# Patient Record
Sex: Male | Born: 1970
Health system: Southern US, Community
[De-identification: ages and names within clinical notes are randomized; demographics above are authoritative.]

## PROBLEM LIST (undated history)

## (undated) DIAGNOSIS — F32A Depression, unspecified: Secondary | ICD-10-CM

## (undated) DIAGNOSIS — F329 Major depressive disorder, single episode, unspecified: Secondary | ICD-10-CM

## (undated) DIAGNOSIS — F419 Anxiety disorder, unspecified: Secondary | ICD-10-CM

---
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
## 2003-09-08 ENCOUNTER — Emergency Department (HOSPITAL_COMMUNITY): Admission: EM | Admit: 2003-09-08 | Discharge: 2003-09-08 | Payer: Self-pay | Admitting: Emergency Medicine

## 2003-11-21 ENCOUNTER — Emergency Department (HOSPITAL_COMMUNITY): Admission: EM | Admit: 2003-11-21 | Discharge: 2003-11-21 | Payer: Self-pay | Admitting: Family Medicine

## 2019-11-25 DIAGNOSIS — Z125 Encounter for screening for malignant neoplasm of prostate: Secondary | ICD-10-CM | POA: Diagnosis not present

## 2019-11-25 DIAGNOSIS — Z1211 Encounter for screening for malignant neoplasm of colon: Secondary | ICD-10-CM | POA: Diagnosis not present

## 2019-11-25 DIAGNOSIS — G479 Sleep disorder, unspecified: Secondary | ICD-10-CM | POA: Diagnosis not present

## 2019-11-25 DIAGNOSIS — Z0000 Encounter for general adult medical examination without abnormal findings: Secondary | ICD-10-CM | POA: Diagnosis not present

## 2019-11-25 DIAGNOSIS — L209 Atopic dermatitis, unspecified: Secondary | ICD-10-CM | POA: Diagnosis not present

## 2019-11-25 DIAGNOSIS — Z13220 Encounter for screening for lipoid disorders: Secondary | ICD-10-CM | POA: Diagnosis not present

## 2019-11-25 DIAGNOSIS — F329 Major depressive disorder, single episode, unspecified: Secondary | ICD-10-CM | POA: Diagnosis not present

## 2019-12-06 ENCOUNTER — Other Ambulatory Visit: Payer: Self-pay

## 2019-12-06 ENCOUNTER — Encounter (HOSPITAL_COMMUNITY): Payer: Self-pay | Admitting: *Deleted

## 2019-12-06 ENCOUNTER — Ambulatory Visit (HOSPITAL_COMMUNITY)
Admission: EM | Admit: 2019-12-06 | Discharge: 2019-12-06 | Disposition: A | Discharge status: Home / Self Care | Payer: 59 | Attending: Family Medicine | Admitting: Family Medicine

## 2019-12-06 ENCOUNTER — Emergency Department (HOSPITAL_COMMUNITY)
Admission: EM | Admit: 2019-12-06 | Discharge: 2019-12-06 | Disposition: A | Discharge status: Home / Self Care | Payer: 59 | Attending: Emergency Medicine | Admitting: Emergency Medicine

## 2019-12-06 ENCOUNTER — Emergency Department (HOSPITAL_COMMUNITY): Payer: 59

## 2019-12-06 DIAGNOSIS — R232 Flushing: Secondary | ICD-10-CM

## 2019-12-06 DIAGNOSIS — R0789 Other chest pain: Secondary | ICD-10-CM

## 2019-12-06 DIAGNOSIS — R42 Dizziness and giddiness: Secondary | ICD-10-CM

## 2019-12-06 DIAGNOSIS — R079 Chest pain, unspecified: Secondary | ICD-10-CM | POA: Diagnosis not present

## 2019-12-06 HISTORY — DX: Anxiety disorder, unspecified: F41.9

## 2019-12-06 HISTORY — DX: Depression, unspecified: F32.A

## 2019-12-06 LAB — BASIC METABOLIC PANEL
Anion gap: 9 (ref 5–15)
BUN: 15 mg/dL (ref 6–20)
CO2: 26 mmol/L (ref 22–32)
Calcium: 9.6 mg/dL (ref 8.9–10.3)
Chloride: 104 mmol/L (ref 98–111)
Creatinine, Ser: 0.99 mg/dL (ref 0.61–1.24)
GFR calc Af Amer: >60 mL/min (ref >60)
GFR calc non Af Amer: >60 mL/min (ref >60)
Glucose, Bld: 93 mg/dL (ref 70–99)
Potassium: 4 mmol/L (ref 3.5–5.1)
Sodium: 139 mmol/L (ref 135–145)

## 2019-12-06 LAB — TROPONIN I (HIGH SENSITIVITY)
Troponin I (High Sensitivity): 3 ng/L (ref <18)
Troponin I (High Sensitivity): 3 ng/L (ref <18)

## 2019-12-06 LAB — CBC
HCT: 45.7 % (ref 39.0–52.0)
Hemoglobin: 16.4 g/dL (ref 13.0–17.0)
MCH: 30.7 pg (ref 26.0–34.0)
MCHC: 35.9 g/dL (ref 30.0–36.0)
MCV: 85.6 fL (ref 80.0–100.0)
Platelets: 193 10*3/uL (ref 150–400)
RBC: 5.34 MIL/uL (ref 4.22–5.81)
RDW: 12 % (ref 11.5–15.5)
WBC: 9.3 10*3/uL (ref 4.0–10.5)
nRBC: 0 % (ref 0.0–0.2)

## 2019-12-06 MED ORDER — SODIUM CHLORIDE 0.9% FLUSH: 3.0000 mL | Freq: Once | INTRAVENOUS | Status: DC

## 2019-12-06 NOTE — ED Triage Notes (Signed)
Patient reports he starting taking escitalopram 11/25/19 and now is reporting heat waves, irribility, fast HR, chest tightness, insomnia. Reports he is only sleeping two hours.

## 2019-12-06 NOTE — Discharge Instructions (Signed)
I feel that some of her symptoms are likely related to the Lexapro would like to taper you off of this medication, take 1/2 tablet for the next 5 days and then you can stop the medication.  Please follow-up with your primary care doctor in the meantime to discuss other medication options.

## 2019-12-06 NOTE — ED Provider Notes (Signed)
[name]   [id number] 12/06/19 Arrival Time: 5038  ASSESSMENT & PLAN:  1. Chest heaviness   2. Episodic lightheadedness   3. Hot flashes     Myriad of symptoms but with reported chest heaviness this morning recommend ED evaluation. No acute changes on ECG.   ECG: Performed today and interpreted by me: normal sinus rhythm; no STEMI. No previous for comparison.  Patient taken to ED via wheelchair. Stable upon discharge.  Reviewed expectations re: course of current medical issues. Questions answered. Outlined signs and symptoms indicating need for more acute intervention. Patient verbalized understanding. After Visit Summary given.   SUBJECTIVE:  History from: patient. William Watkins is a 49 y.o. male who presents with complaint of chest heaviness beginning approx 07:30 this morning. Has eased but remains present. Reports insomnia over the past week along with frequent hot flashes. Feels very irritable. Questions increased heart rate at times. Normal PO intake without n/v. No reports SOB with chest symptoms. Afebrile. No recent illnesses. No LE edema. Non-smoker. No heavy or frequent alcohol use. No drug use. No recent immobilization, surgery, or prolonged travel. Does report starting Lexapro about 1.5 w ago; questions if medications causing some of his symptoms. Denies: exertional chest pressure/discomfort, orthopnea, paroxysmal nocturnal dyspnea and syncope. Aggravating factors: have not been identified. Alleviating factors: have not been identified. Ambulatory without assistance.  OBJECTIVE:  Vitals:   12/06/19 0926  BP: (!) 131/94  Pulse: 82  Resp: 17  Temp: 98.5 F (36.9 C)  TempSrc: Oral  SpO2: 97%    General appearance: alert, oriented, no acute distress; does appear anxious Eyes: PERRLA; EOMI; conjunctivae normal HENT: normocephalic; atraumatic Neck: supple with FROM Lungs: without labored respirations; speaks full sentences without  difficulty Heart: regular Extremities: without edema; without calf swelling Skin: warm and dry; without rash or lesions Neuro: normal gait Psychological: alert and cooperative; normal mood and affect    Allergies  Allergen Reactions  . Penicillins Rash    Childhood allergy     Past Medical History:  Diagnosis Date  . Anxiety   . Depression    Social History   Socioeconomic History  . Marital status: Single    Spouse name: Not on file  . Number of children: Not on file  . Years of education: Not on file  . Highest education level: Not on file  Occupational History  . Not on file  Tobacco Use  . Smoking status: Never Smoker  Substance and Sexual Activity  . Alcohol use: Never  . Drug use: Never  . Sexual activity: Not on file  Other Topics Concern  . Not on file  Social History Narrative  . Not on file   Social Determinants of Health   Financial Resource Strain:   . Difficulty of Paying Living Expenses:   Food Insecurity:   . Worried About Charity fundraiser in the Last Year:   . Arboriculturist in the Last Year:   Transportation Needs:   . Film/video editor (Medical):   Marland Kitchen Lack of Transportation (Non-Medical):   Physical Activity:   . Days of Exercise per Week:   . Minutes of Exercise per Session:   Stress:   . Feeling of Stress :   Social Connections:   . Frequency of Communication with Friends and Family:   . Frequency of Social Gatherings with Friends and Family:   . Attends Religious Services:   . Active Member of Clubs or Organizations:   .  Attends Archivist Meetings:   Marland Kitchen Marital Status:   Intimate Partner Violence:   . Fear of Current or Ex-Partner:   . Emotionally Abused:   Marland Kitchen Physically Abused:   . Sexually Abused:      No past surgical history on file.   Vanessa Kick, MD 12/06/19 1216

## 2019-12-06 NOTE — ED Notes (Signed)
Bed: UCTR Expected date:  Expected time:  Means of arrival:  Comments: SICK ROOMS 9-10 

## 2019-12-06 NOTE — ED Notes (Signed)
Patient Alert and oriented to baseline. Stable and ambulatory to baseline. Patient verbalized understanding of the discharge instructions.  Patient belongings were taken by the patient.   

## 2019-12-06 NOTE — ED Provider Notes (Signed)
[facility] EMERGENCY DEPARTMENT Provider Note   CSN: [id number] Arrival date & time: 12/06/19  2703     History Chief Complaint  Patient presents with  . Chest Pain    William Watkins is a 49 y.o. male.  William Watkins is a 49 y.o. male with a history of anxiety and depression, who presents to the emergency department for evaluation of chest discomfort.  Patient reports that his primary care doctor started him on a new medication, Lexapro 10 mg, he began taking this on 4/1.  He reports since then he has been having intermittent episodes of chest discomfort L as well as what he describes as waves of heat going over his body.  He is occasionally felt a bit lightheaded but has not had any syncopal episodes.  He reports last night for about an hour he had intermittent flashes of heat that kept waking him up.  He states that at baseline he has had sleep problems for about 6 months, but since starting the Lexapro he is only been able to sleep for about 2 hours a night.  He reports he had some chest discomfort last night, and again had an episode this morning while at work where his chest felt heavy.  Symptoms were not exertional or pleuritic.  Not associated with any nausea, vomiting or diaphoresis.  Patient denies any associated tremors.  He does report an intermittent headache.  No visual changes.  He states he is not been on a medication like this before.  Does state that even before starting this medication he would have occasional chest discomfort but has never seen a cardiologist.  He does not feel particularly anxious.  He was initially seen at urgent care, but sent to the ED for further evaluation of chest heaviness.  No history of heart disease, no history of PE or DVT, no recent long distance travel or surgeries, no hormonal therapy.        Past Medical History:  Diagnosis Date  . Anxiety   . Depression     There are no problems to display for this  patient.   History reviewed. No pertinent surgical history.     History reviewed. No pertinent family history.  Social History   Tobacco Use  . Smoking status: Never Smoker  Substance Use Topics  . Alcohol use: Never  . Drug use: Never    Home Medications Prior to Admission medications   Medication Sig Start Date End Date Taking? Authorizing Provider  escitalopram (LEXAPRO) 10 MG tablet Take 10 mg by mouth daily.    [provider]    Allergies    Penicillins  Review of Systems   Review of Systems  Constitutional: Negative for chills and fever.  HENT: Negative.   Eyes: Negative for visual disturbance.  Respiratory: Negative for cough and shortness of breath.   Cardiovascular: Positive for chest pain.  Gastrointestinal: Negative for abdominal pain, nausea and vomiting.  Genitourinary: Negative for dysuria and frequency.  Musculoskeletal: Negative for arthralgias and myalgias.  Skin: Negative for color change and rash.  Neurological: Positive for light-headedness and headaches.  Psychiatric/Behavioral: Positive for sleep disturbance.  All other systems reviewed and are negative.   Physical Exam Updated Vital Signs BP (!) 138/92 (BP Location: Left Arm)   Pulse 83   Temp 97.8 F (36.6 C) (Oral)   Resp 14   Ht 5\' 10"  (1.778 m)   Wt 73 kg   SpO2 100%   BMI  23.10 kg/m   Physical Exam Vitals and nursing note reviewed.  Constitutional:      General: He is not in acute distress.    Appearance: He is well-developed and normal weight. He is not ill-appearing or diaphoretic.     Comments: Appears somewhat anxious, but is well-appearing and in no distress  HENT:     Head: Normocephalic and atraumatic.  Eyes:     General:        Right eye: No discharge.        Left eye: No discharge.     Pupils: Pupils are equal, round, and reactive to light.  Cardiovascular:     Rate and Rhythm: Normal rate and regular rhythm.     Pulses:          Radial pulses are  2+ on the right side and 2+ on the left side.     Heart sounds: Normal heart sounds. No murmur. No friction rub. No gallop.   Pulmonary:     Effort: Pulmonary effort is normal. No respiratory distress.     Breath sounds: Normal breath sounds. No wheezing or rales.     Comments: Respirations equal and unlabored, patient able to speak in full sentences, lungs clear to auscultation bilaterally Chest:     Chest wall: No tenderness.  Abdominal:     General: Bowel sounds are normal. There is no distension.     Palpations: Abdomen is soft. There is no mass.     Tenderness: There is no abdominal tenderness. There is no guarding.     Comments: Abdomen soft, nondistended, nontender to palpation in all quadrants without guarding or peritoneal signs  Musculoskeletal:        General: No deformity.     Cervical back: Neck supple.  Skin:    General: Skin is warm and dry.     Capillary Refill: Capillary refill takes less than 2 seconds.  Neurological:     Mental Status: He is alert.     Coordination: Coordination normal.     Comments: Speech is clear, able to follow commands Moves extremities without ataxia, coordination intact  Psychiatric:        Mood and Affect: Mood is anxious. Affect is flat.        Behavior: Behavior normal. Behavior is cooperative.     ED Results / Procedures / Treatments   Labs (all labs ordered are listed, but only abnormal results are displayed) Labs Reviewed  BASIC METABOLIC PANEL  CBC  TROPONIN I (HIGH SENSITIVITY)  TROPONIN I (HIGH SENSITIVITY)    EKG EKG Interpretation  Date/Time:  Monday December 06 2019 13:12:15 EDT Ventricular Rate:  68 PR Interval:    QRS Duration: 82 QT Interval:  416 QTC Calculation: 443 R Axis:   82 Text Interpretation: Sinus rhythm Anteroseptal infarct, age indeterminate Baseline wander Abnormal ECG Confirmed by Carmin Muskrat [id number]) on 12/06/2019 1:26:29 PM   Radiology DG Chest 2 View  Result Date: 12/06/2019 CLINICAL  DATA:  Chest pain. EXAM: CHEST - 2 VIEW COMPARISON:  None. FINDINGS: The heart size and mediastinal contours are within normal limits. Both lungs are clear. No pneumothorax or pleural effusion is noted. The visualized skeletal structures are unremarkable. IMPRESSION: No active cardiopulmonary disease. Electronically Signed   By: Marijo Conception M.D.   On: 12/06/2019 10:43    Procedures Procedures (including critical care time)  Medications Ordered in ED Medications  sodium chloride flush (NS) 0.9 % injection 3 mL (has no administration in  time range)    ED Course  I have reviewed the triage vital signs and the nursing notes.  Pertinent labs & imaging results that were available during my care of the patient were reviewed by me and considered in my medical decision making (see chart for details).    MDM Rules/Calculators/A&P                     Patient presents to the emergency department with chest pain. Patient nontoxic appearing, in no apparent distress, vitals without significant abnormality. Fairly benign physical exam.    DDX: Feel symptoms are most likely related to medication side effect, but also consider, ACS, pulmonary embolism, dissection, pneumothorax, pneumonia, arrhythmia, severe anemia, MSK, GERD, anxiety. Evaluation initiated with labs, EKG, and CXR. Patient on cardiac monitor.   CBC: No leukocytosis, normal hemoglobin BMP: No electrolyte derangements and normal renal function Troponin: Negative x2 EKG: Sinus rhythm with some anteroseptal artifact. CXR:  Negative, without infiltrate, effusion, pneumothorax, or fracture/dislocation.   EKG without obvious acute ischemia, delta troponin negative, doubt ACS. Patient is low risk wells, PERC negative, doubt pulmonary embolism. Pain is not a tearing sensation, symmetric pulses, no widening of mediastinum on CXR, doubt dissection. Cardiac monitor reviewed, no notable arrhythmias or tachycardia.  I suspect symptoms may be related  to new SSRI, but patient does not appear to be in serotonin syndrome.  Will taper patient off of this medication and have him follow-up closely with PCP.  Patient has appeared hemodynamically stable throughout ER visit and appears safe for discharge with close PCP/cardiology follow up. I discussed results, treatment plan, need for PCP follow-up, and return precautions with the patient. Provided opportunity for questions, patient confirmed understanding and is in agreement with plan.   Final Clinical Impression(s) / ED Diagnoses Final diagnoses:  Atypical chest pain    Rx / DC Orders ED Discharge Orders    None       Legrand Rams 12/06/19 1434    Gerhard Munch, MD 12/08/19 1539

## 2019-12-06 NOTE — ED Triage Notes (Addendum)
Pt reports onset this am of hot flashes, palpitations, chest pressure, fatigue and just not feeling well. Ekg done at triage. No acute distress is noted.

## 2019-12-15 DIAGNOSIS — F329 Major depressive disorder, single episode, unspecified: Secondary | ICD-10-CM | POA: Diagnosis not present

## 2019-12-15 DIAGNOSIS — G479 Sleep disorder, unspecified: Secondary | ICD-10-CM | POA: Diagnosis not present

## 2020-01-10 MED FILL — traZODone HCL 50 MG TABS: 50 | 30 days supply | Qty: 30 | Fill #0

## 2020-01-13 MED FILL — buPROPion HCL ER (XL) 150 M: 150 | 30 days supply | Qty: 30 | Fill #0

## 2020-01-25 DIAGNOSIS — G479 Sleep disorder, unspecified: Secondary | ICD-10-CM | POA: Diagnosis not present

## 2020-01-25 DIAGNOSIS — F329 Major depressive disorder, single episode, unspecified: Secondary | ICD-10-CM | POA: Diagnosis not present

## 2020-01-25 MED FILL — BUPROPION HCL ER (XL) 300 M: 300 | 30 days supply | Qty: 30 | Fill #0

## 2020-01-28 MED FILL — traZODone HCL 100 MG TABS: 100 | 30 days supply | Qty: 60 | Fill #0

## 2020-02-22 DIAGNOSIS — F4011 Social phobia, generalized: Secondary | ICD-10-CM | POA: Diagnosis not present

## 2020-02-22 DIAGNOSIS — F5105 Insomnia due to other mental disorder: Secondary | ICD-10-CM | POA: Diagnosis not present

## 2020-02-22 DIAGNOSIS — F330 Major depressive disorder, recurrent, mild: Secondary | ICD-10-CM | POA: Diagnosis not present

## 2020-02-22 MED FILL — traZODone HCL 50 MG TABS: 50 | 30 days supply | Qty: 75 | Fill #0

## 2020-02-29 MED FILL — traZODone HCL 100 MG TABS: 100 | 90 days supply | Qty: 135 | Fill #0

## 2021-04-26 IMAGING — DX DG CHEST 2V
2 series · 2 of 2 positions shown · non-contrast
Comparison: None.

CLINICAL DATA: Chest pain.

EXAM:
CHEST - 2 VIEW

[w chest lat]
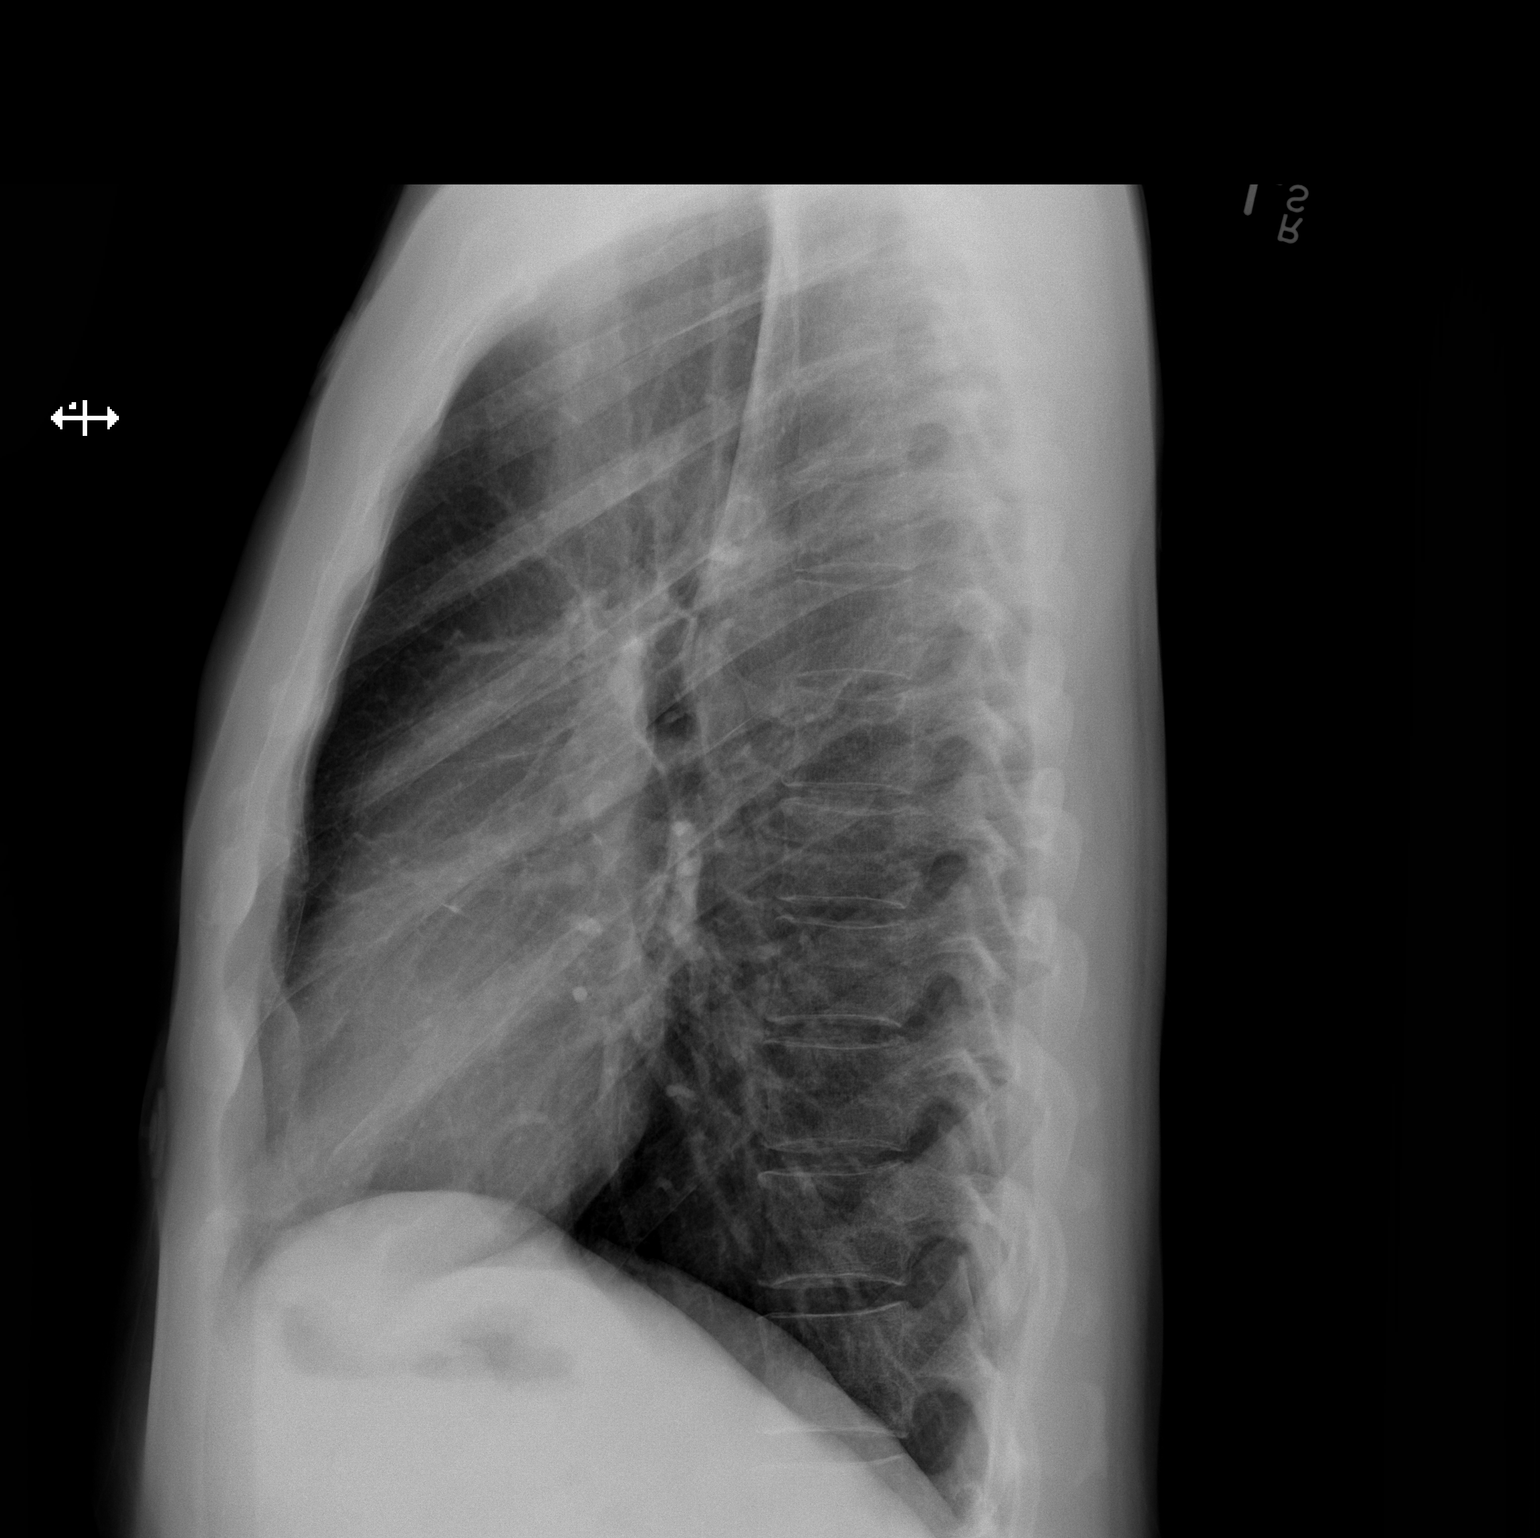

[w chest pa]
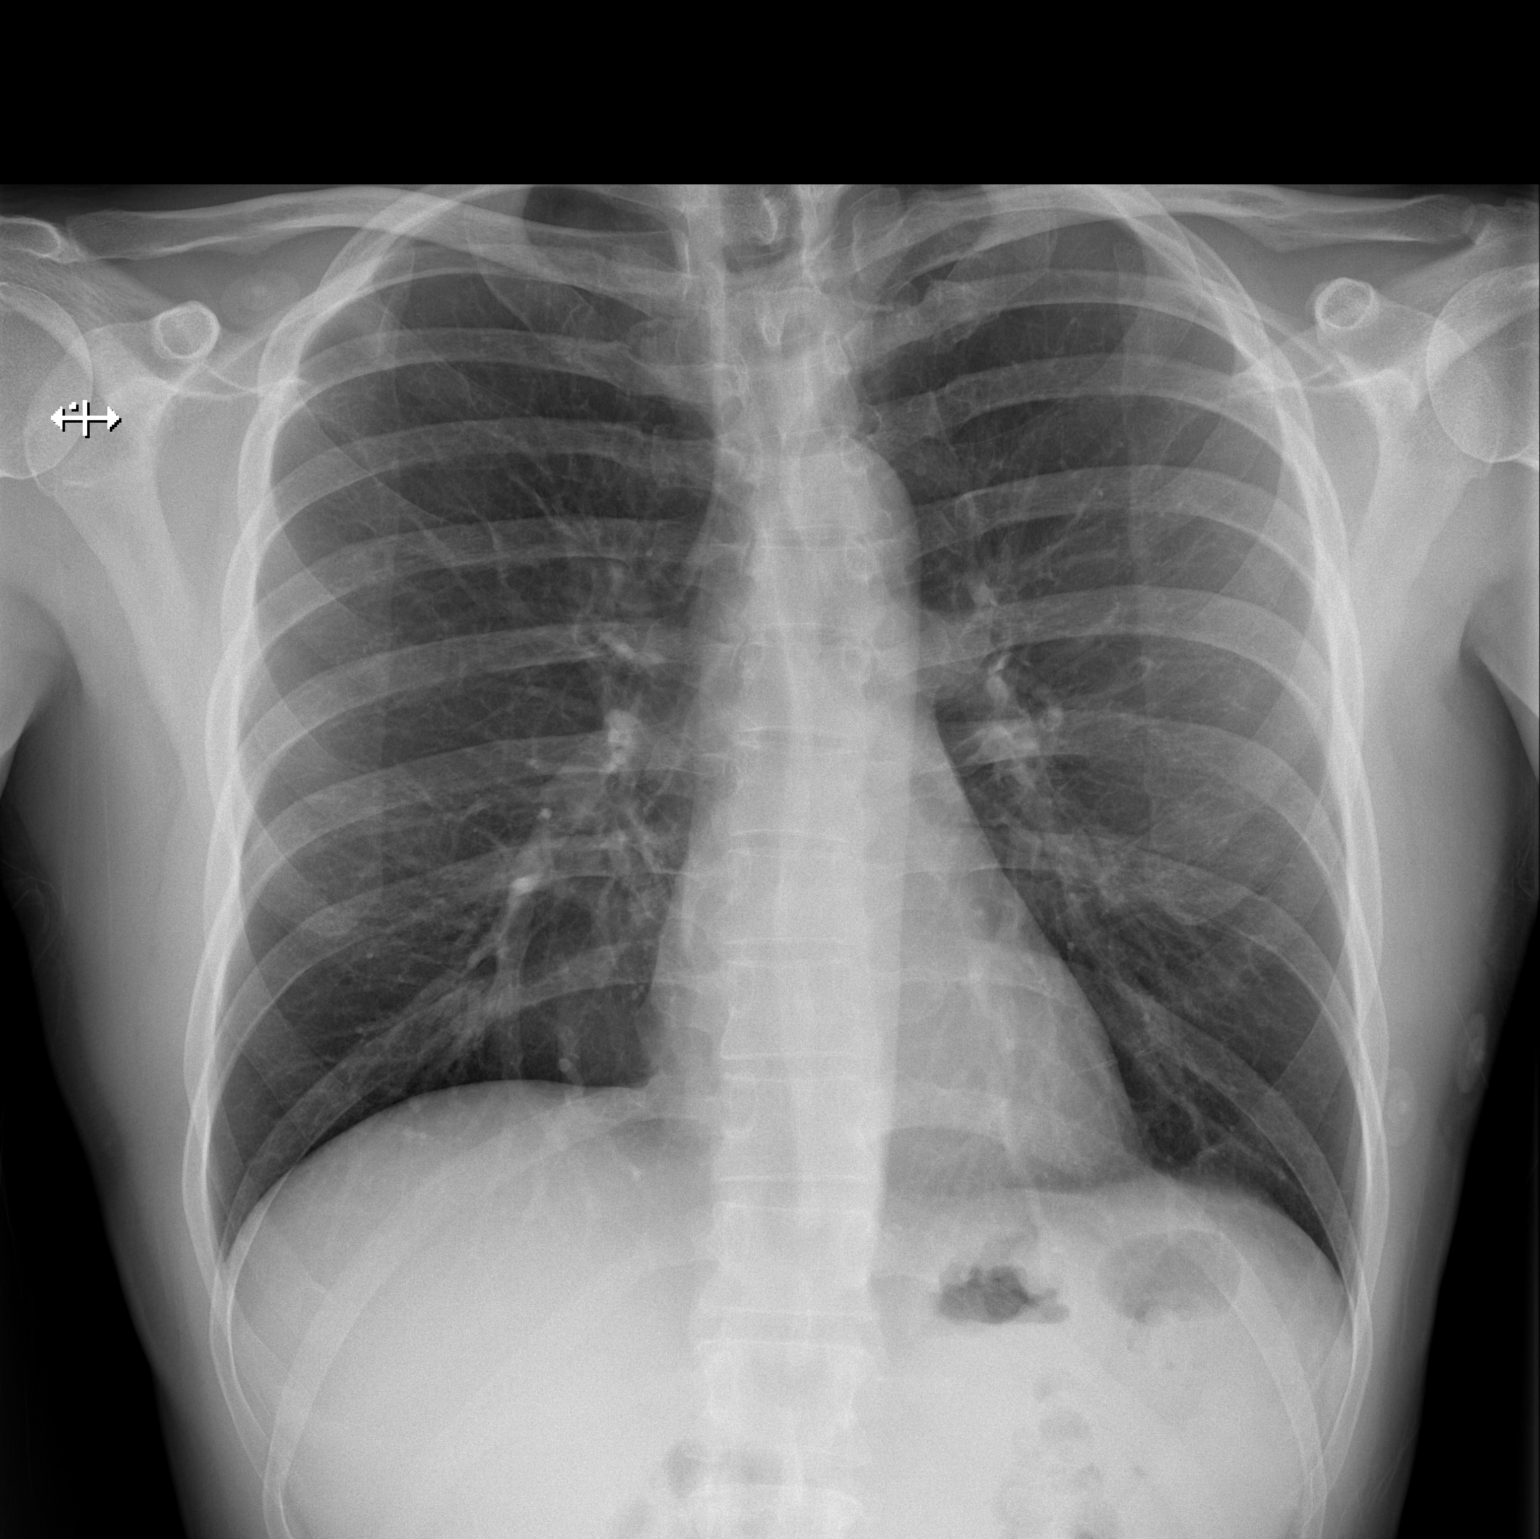

[2 of 2 positions shown; findings below may reference images not displayed]

FINDINGS: The heart size and mediastinal contours are within normal limits.
Both lungs are clear. No pneumothorax or pleural effusion is noted.
The visualized skeletal structures are unremarkable.
IMPRESSION: No active cardiopulmonary disease.
